# Patient Record
Sex: Male | Born: 1965 | Race: White | Hispanic: No | Marital: Married | Smoking: Never smoker
Health system: Southern US, Community
[De-identification: ages and names within clinical notes are randomized; demographics above are authoritative.]

---

## 2017-01-02 ENCOUNTER — Encounter: Payer: Self-pay | Admitting: Family Medicine

## 2017-01-02 ENCOUNTER — Ambulatory Visit: Payer: 59 | Admitting: Family Medicine

## 2017-01-02 VITALS — BP 148/98 | HR 87 | Ht 76.0 in | Wt 319.0 lb

## 2017-01-02 DIAGNOSIS — M999 Biomechanical lesion, unspecified: Secondary | ICD-10-CM | POA: Diagnosis not present

## 2017-01-02 DIAGNOSIS — M5442 Lumbago with sciatica, left side: Secondary | ICD-10-CM | POA: Diagnosis not present

## 2017-01-02 DIAGNOSIS — G5702 Lesion of sciatic nerve, left lower limb: Secondary | ICD-10-CM

## 2017-01-02 DIAGNOSIS — G8929 Other chronic pain: Secondary | ICD-10-CM

## 2017-01-02 MED ORDER — GABAPENTIN 100 MG PO CAPS: 200.0000 mg | ORAL_CAPSULE | Freq: Every day | ORAL | 3 refills | Status: AC

## 2017-01-02 NOTE — Assessment & Plan Note (Signed)
Piriformis syndrome.  Patient given home exercises to work with Event organiserathletic trainer. Patient will try home exercises working on hip abductors, icing regimen, which activities to do which wants to avoid.  She will follow-up with me again in 4-6 weeks for further evaluation and treatment

## 2017-01-02 NOTE — Patient Instructions (Signed)
Good to see you  Ice 20 minutes 2 times daily. Usually after activity and before bed. Exercises 3 times a week.  Tennis abll in back left pocket with a lot of sitting.  Gabapentin 1-2 pills at night can help Stay active.  See em again in 3-4 weeks

## 2017-01-02 NOTE — Assessment & Plan Note (Signed)
Decision today to treat with OMT was based on Physical Exam  After verbal consent patient was treated with HVLA, ME, FPR techniques in cervical, thoracic, lumbar and sacral areas  Patient tolerated the procedure well with improvement in symptoms  Patient given exercises, stretches and lifestyle modifications  See medications in patient instructions if given  Patient will follow up in 4-6 weeks 

## 2017-01-02 NOTE — Progress Notes (Signed)
Tawana ScaleZach Bret Stamour D.O. [facility] [address] [place], [place] [address] Phone: [phone]) [phone] Subjective:    CC: Back and leg pain  BJY:NWGNFAOZHYHPI:Subjective  Luis Saunders is a 51 y.o. male coming in for back pain. Seventeen years ago he saw a chiropractor for sciatica. He has been using a Land[facility] for years following. The past couple of months he has not had as much relief. He has radiating pain down the back side of the left leg going into the calf. Patient has used ice and IBU which helps temporarily. He does work out and does some stretching.  Patient states that it intermittently seems to be going down the patient states that there is no weakness.  Rates the severity of pain is 6 out of 10 but seems to be worsening.  States sometimes at night can be uncomfortable as well       History reviewed. No pertinent past medical history. History reviewed. No pertinent surgical history. Social History   Socioeconomic History  . Marital status: Married    Spouse name: None  . Number of children: None  . Years of education: None  . Highest education level: None  Social Needs  . Financial resource strain: None  . Food insecurity - worry: None  . Food insecurity - inability: None  . Transportation needs - medical: None  . Transportation needs - non-medical: None  Occupational History  . None  Tobacco Use  . Smoking status: None  Substance and Sexual Activity  . Alcohol use: None  . Drug use: None  . Sexual activity: None  Other Topics Concern  . None  Social History Narrative  . None   Not on File History reviewed. No pertinent family history.  No family history of autoimmune diseases   Past medical history, social, surgical and family history all reviewed in electronic medical record.  No pertanent information unless stated regarding to the chief complaint.   Review of Systems:Review of systems updated and as accurate as of 01/02/17  No headache, visual changes, nausea,  vomiting, diarrhea, constipation, dizziness, abdominal pain, skin rash, fevers, chills, night sweats, weight loss, swollen lymph nodes, body aches, joint swelling chest pain, shortness of breath, mood changes.  Positive muscle aches  Objective  Blood pressure (!) 148/98, pulse 87, height 6\' 4"  (1.93 m), weight (!) 319 lb (144.7 kg), SpO2 98 %. Systems examined below as of 01/02/17   General: No apparent distress alert and oriented x3 mood and affect normal, dressed appropriately.  HEENT: Pupils equal, extraocular movements intact  Respiratory: Patient's speak in full sentences and does not appear short of breath  Cardiovascular: No lower extremity edema, non tender, no erythema  Skin: Warm dry intact with no signs of infection or rash on extremities or on axial skeleton.  Abdomen: Soft nontender  Neuro: Cranial nerves II through XII are intact, neurovascularly intact in all extremities with 2+ DTRs and 2+ pulses.  Lymph: No lymphadenopathy of posterior or anterior cervical chain or axillae bilaterally.  Gait normal with good balance and coordination.  MSK:  Non tender with full range of motion and good stability and symmetric strength and tone of shoulders, elbows, wrist, hip, knee and ankles bilaterally.  Back Exam:  Inspection: Unremarkable  Motion: Flexion 45 deg, Extension 15 deg, Side Bending to 45 deg bilaterally,  Rotation to 45 deg bilaterally  SLR laying: Negative  XSLR laying: Negative  Palpable tenderness: Pain over the left sacroiliac joint and mild over the greater trochanteric  area. FABER: Mild positive left. Sensory change: Gross sensation intact to all lumbar and sacral dermatomes.  Reflexes: 2+ at both patellar tendons, 2+ at achilles tendons, Babinski's downgoing.  Strength at foot  Plantar-flexion: 5/5 Dorsi-flexion: 5/5 Eversion: 5/5 Inversion: 5/5  Leg strength  Quad: 5/5 Hamstring: 5/5 Hip flexor: 5/5 Hip abductors: 4/5 but symmetric Gait unremarkable. Osteopathic  findings C2 flexed rotated and side bent right C7 flexed rotated and side bent left T3 extended rotated and side bent right inhaled third rib T9 extended rotated and side bent left L2 flexed rotated and side bent right Sacrum right on right  Procedure 97110; 15 additional minutes spent for Therapeutic exercises as stated in above notes.  This included exercises focusing on stretching, strengthening, with significant focus on eccentric aspects.   Long term goals include an improvement in range of motion, strength, endurance as well as avoiding reinjury. Patient's frequency would include in 1-2 times a day, 3-5 times a week for a duration of 6-12 weeks. Piriformis Syndrome  Using an anatomical model, reviewed with the patient the structures involved and how they related to diagnosis. The patient indicated understanding.   The patient was given a handout from Dr. Ailene Ardsouzier's book "The Sports Medicine Patient Advisor" describing the anatomy and rehabilitation of the following condition: Piriformis Syndrome  Also given a handout with more extensive Piriformis stretching, hip flexor and abductor strengthening, ham stretching  Rec deep massage, explained self-massage with ball  Proper technique shown and discussed handout in great detail with ATC.  All questions were discussed and answered.      Impression and Recommendations:     This case required medical decision making of moderate complexity.      Note: This dictation was prepared with Dragon dictation along with smaller phrase technology. Any transcriptional errors that result from this process are unintentional.

## 2017-01-11 ENCOUNTER — Ambulatory Visit: Payer: Self-pay | Admitting: Family Medicine

## 2017-02-04 NOTE — Progress Notes (Signed)
[facility] [address] [place], [place] [address] Phone: [phone] Subjective:     CC: Back pain  BJY:NWGNFAOZHY  Coron Rossano is a 52 y.o. male coming in with complaint of back pain.  Found to have a left-sided low back pain.  Seem to be more of a piriformis syndrome.  Patient was to get x-rays which patient never did.  Patient did have osteopathic manipulation.  Patient states doing relatively well overall.  Patient intermittent.  States that that does seem to be somewhat worse with standing.     No past medical history on file. No past surgical history on file. Social History   Socioeconomic History  . Marital status: Married    Spouse name: None  . Number of children: None  . Years of education: None  . Highest education level: None  Social Needs  . Financial resource strain: None  . Food insecurity - worry: None  . Food insecurity - inability: None  . Transportation needs - medical: None  . Transportation needs - non-medical: None  Occupational History  . None  Tobacco Use  . Smoking status: Never Smoker  . Smokeless tobacco: Never Used  Substance and Sexual Activity  . Alcohol use: None  . Drug use: None  . Sexual activity: None  Other Topics Concern  . None  Social History Narrative  . None   Not on File No family history on file.  No family history of autoimmune disease   Past medical history, social, surgical and family history all reviewed in electronic medical record.  No pertanent information unless stated regarding to the chief complaint.   Review of Systems:Review of systems updated and as accurate as of 02/06/17  No headache, visual changes, nausea, vomiting, diarrhea, constipation, dizziness, abdominal pain, skin rash, fevers, chills, night sweats, weight loss, swollen lymph nodes, body aches, joint swelling, chest pain, shortness of breath, mood changes.  Positive muscle aches  Objective  Blood pressure  140/90, pulse 86, height 6\' 4"  (1.93 m), weight (!) 322 lb (146.1 kg), SpO2 98 %. Systems examined below as of 02/06/17   General: No apparent distress alert and oriented x3 mood and affect normal, dressed appropriately.  HEENT: Pupils equal, extraocular movements intact  Respiratory: Patient's speak in full sentences and does not appear short of breath  Cardiovascular: No lower extremity edema, non tender, no erythema  Skin: Warm dry intact with no signs of infection or rash on extremities or on axial skeleton.  Abdomen: Soft nontender  Neuro: Cranial nerves II through XII are intact, neurovascularly intact in all extremities with 2+ DTRs and 2+ pulses.  Lymph: No lymphadenopathy of posterior or anterior cervical chain or axillae bilaterally.  Gait normal with good balance and coordination.  MSK:  Non tender with full range of motion and good stability and symmetric strength and tone of shoulders, elbows, wrist, hip, knee and ankles bilaterally.  Back Exam:  Inspection: Mild loss of lordosis with poor core strength Motion: Flexion 45 deg, Extension 20 deg, Side Bending to 35 deg bilaterally,  Rotation to 05 deg bilaterally  SLR laying: Negative  XSLR laying: Negative  Palpable tenderness: Tender to palpation of the paraspinal musculature lumbar spine left greater than right. FABER: negative. Sensory change: Gross sensation intact to all lumbar and sacral dermatomes.  Reflexes: 2+ at both patellar tendons, 2+ at achilles tendons, Babinski's downgoing.  Strength at foot  Plantar-flexion: 5/5 Dorsi-flexion: 5/5 Eversion: 5/5 Inversion: 5/5  Leg strength  Quad: 5/5 Hamstring: 5/5 Hip flexor: 5/5 Hip abductors: 5/5  Gait unremarkable.  Osteopathic findings C2 flexed rotated and side bent right C4 flexed rotated and side bent left C7 flexed rotated and side bent left T3 extended rotated and side bent right inhaled third rib T12 extended rotated and side bent left L2 flexed rotated and  side bent right Sacrum right on right     Impression and Recommendations:     This case required medical decision making of moderate complexity.      Note: This dictation was prepared with Dragon dictation along with smaller phrase technology. Any transcriptional errors that result from this process are unintentional.

## 2017-02-06 ENCOUNTER — Encounter: Payer: Self-pay | Admitting: Family Medicine

## 2017-02-06 ENCOUNTER — Ambulatory Visit (INDEPENDENT_AMBULATORY_CARE_PROVIDER_SITE_OTHER)
Admission: RE | Admit: 2017-02-06 | Discharge: 2017-02-06 | Disposition: A | Discharge status: Home / Self Care | Payer: 59 | Source: Ambulatory Visit | Attending: Family Medicine | Admitting: Family Medicine

## 2017-02-06 ENCOUNTER — Ambulatory Visit: Payer: 59 | Admitting: Family Medicine

## 2017-02-06 VITALS — BP 140/90 | HR 86 | Ht 76.0 in | Wt 322.0 lb

## 2017-02-06 DIAGNOSIS — M5442 Lumbago with sciatica, left side: Secondary | ICD-10-CM

## 2017-02-06 DIAGNOSIS — G8929 Other chronic pain: Secondary | ICD-10-CM

## 2017-02-06 DIAGNOSIS — M545 Low back pain, unspecified: Secondary | ICD-10-CM | POA: Insufficient documentation

## 2017-02-06 DIAGNOSIS — M999 Biomechanical lesion, unspecified: Secondary | ICD-10-CM | POA: Diagnosis not present

## 2017-02-06 NOTE — Assessment & Plan Note (Signed)
Patient does have some sciatica.  Encouraged him to take the gabapentin.  Patient has a negative straight leg test but states that the pain seems to be worse with standing.  This is more concerning for spinal stenosis than the piriformis.  No weakness noted on exam today.  Does respond well to osteopathic manipulation.  Encouraged to continue to work on core strength, weight loss, and home exercises.  Follow-up again in 4 weeks

## 2017-02-06 NOTE — Patient Instructions (Signed)
Good to see you  I think we are doing well  Ice 20 minutes 2 times daily. Usually after activity and before bed. Xray downstairs See me again in 4 weeks Read about Dr. Dalbert GarnetBeasley and weight management Can't wait to meet your better half.

## 2017-02-06 NOTE — Assessment & Plan Note (Signed)
Decision today to treat with OMT was based on Physical Exam  After verbal consent patient was treated with HVLA, ME, FPR techniques in cervical, thoracic, lumbar and sacral areas  Patient tolerated the procedure well with improvement in symptoms  Patient given exercises, stretches and lifestyle modifications  See medications in patient instructions if given  Patient will follow up in 4 weeks 

## 2017-02-07 ENCOUNTER — Encounter: Payer: Self-pay | Admitting: Family Medicine

## 2017-02-09 ENCOUNTER — Encounter: Payer: Self-pay | Admitting: Family Medicine

## 2017-02-13 MED ORDER — AMBULATORY NON FORMULARY MEDICATION: 12 refills | Status: AC

## 2017-03-05 NOTE — Progress Notes (Signed)
Tawana ScaleZach Everlene Cunning D.O. [facility] [address] [place], [place] [address] Phone: [phone] Subjective:    CC: Back pain follow-up  BJY:NWGNFAOZHYHPI:Subjective  Luis Saunders is a 52 y.o. male coming in with complaint of low back pain.  Found to have a left-sided piriformis.  X-rays today show mild osteoarthritic changes.  Started osteopathic manipulation.  Concerned some of his symptoms could be secondary to spinal stenosis.  Patient was to start massage therapy as well.  Patient was to do home exercises.  Patient states patient has been doing relatively well overall.  Patient.  Did have some good improvement though with acupuncture as well.     No past medical history on file. No past surgical history on file. Social History   Socioeconomic History  . Marital status: Married    Spouse name: None  . Number of children: None  . Years of education: None  . Highest education level: None  Social Needs  . Financial resource strain: None  . Food insecurity - worry: None  . Food insecurity - inability: None  . Transportation needs - medical: None  . Transportation needs - non-medical: None  Occupational History  . None  Tobacco Use  . Smoking status: Never Smoker  . Smokeless tobacco: Never Used  Substance and Sexual Activity  . Alcohol use: None  . Drug use: None  . Sexual activity: None  Other Topics Concern  . None  Social History Narrative  . None   Not on File No family history on file.   Past medical history, social, surgical and family history all reviewed in electronic medical record.  No pertanent information unless stated regarding to the chief complaint.   Review of Systems:Review of systems updated and as accurate as of 03/06/17  No headache, visual changes, nausea, vomiting, diarrhea, constipation, dizziness, abdominal pain, skin rash, fevers, chills, night sweats, weight loss, swollen lymph nodes, body aches, joint swelling, muscle aches, chest pain, shortness  of breath, mood changes.   Objective  Blood pressure 136/90, pulse 87, height 6\' 4"  (1.93 m), weight (!) 320 lb (145.2 kg), SpO2 98 %. Systems examined below as of 03/06/17   General: No apparent distress alert and oriented x3 mood and affect normal, dressed appropriately.  HEENT: Pupils equal, extraocular movements intact  Respiratory: Patient's speak in full sentences and does not appear short of breath  Cardiovascular: No lower extremity edema, non tender, no erythema  Skin: Warm dry intact with no signs of infection or rash on extremities or on axial skeleton.  Abdomen: Soft nontender  Neuro: Cranial nerves II through XII are intact, neurovascularly intact in all extremities with 2+ DTRs and 2+ pulses.  Lymph: No lymphadenopathy of posterior or anterior cervical chain or axillae bilaterally.  Gait normal with good balance and coordination.  MSK:  Non tender with full range of motion and good stability and symmetric strength and tone of shoulders, elbows, wrist, hip, knee and ankles bilaterally.  Back Exam:  Inspection: Mild loss of lordosis and some mild poor core strength Motion: Flexion 45 deg, Extension 20 deg, Side Bending to 35 deg bilaterally,  Rotation to 35 deg bilaterally  SLR laying: Negative  XSLR laying: Negative  Palpable tenderness: Tender to palpation the paraspinal musculature lumbar spine right greater than left.Marland Kitchen. FABER: Tightness on the right. Sensory change: Gross sensation intact to all lumbar and sacral dermatomes.  Reflexes: 2+ at both patellar tendons, 2+ at achilles tendons, Babinski's downgoing.  Strength at foot  Plantar-flexion:  5/5 Dorsi-flexion: 5/5 Eversion: 5/5 Inversion: 5/5  Leg strength  Quad: 5/5 Hamstring: 5/5 Hip flexor: 5/5 Hip abductors: 5/5  Gait unremarkable.  Osteopathic findings T5 extended rotated and side bent left L1 flexed rotated and side bent right Sacrum right on right    Impression and Recommendations:     This case  required medical decision making of moderate complexity.      Note: This dictation was prepared with Dragon dictation along with smaller phrase technology. Any transcriptional errors that result from this process are unintentional.

## 2017-03-06 ENCOUNTER — Ambulatory Visit (INDEPENDENT_AMBULATORY_CARE_PROVIDER_SITE_OTHER): Payer: 59 | Admitting: Family Medicine

## 2017-03-06 ENCOUNTER — Encounter: Payer: Self-pay | Admitting: Family Medicine

## 2017-03-06 VITALS — BP 136/90 | HR 87 | Ht 76.0 in | Wt 320.0 lb

## 2017-03-06 DIAGNOSIS — G8929 Other chronic pain: Secondary | ICD-10-CM | POA: Diagnosis not present

## 2017-03-06 DIAGNOSIS — M5442 Lumbago with sciatica, left side: Secondary | ICD-10-CM | POA: Diagnosis not present

## 2017-03-06 DIAGNOSIS — M999 Biomechanical lesion, unspecified: Secondary | ICD-10-CM | POA: Diagnosis not present

## 2017-03-06 NOTE — Assessment & Plan Note (Signed)
Decision today to treat with OMT was based on Physical Exam  After verbal consent patient was treated with HVLA, ME, FPR techniques in cervical, thoracic, lumbar and sacral areas  Patient tolerated the procedure well with improvement in symptoms  Patient given exercises, stretches and lifestyle modifications  See medications in patient instructions if given  Patient will follow up in 6-8 weeks 

## 2017-03-06 NOTE — Patient Instructions (Signed)
Good to see you  Ice is your friend I like the accupuncture if it helps Overall not bad New exercises for the back  Lets get you moving See me again in 4-6 weeks. Can consider labs

## 2017-03-06 NOTE — Assessment & Plan Note (Signed)
Responding well to osteopathic manipulation..  Doing well with the over-the-counter medications.  X-rays only showed mild osteoarthritic changes.  Still signs and symptoms is consistent with the potential for spinal stenosis.  We discussed we can always inject the piriformis for diagnostic and therapeutic purposes.  Advanced imaging would be warranted if any weakness occurs.  Follow-up with me again now in 6-8 weeks.

## 2018-04-20 IMAGING — DX DG LUMBAR SPINE 2-3V
3 series · 3 of 3 positions shown · non-contrast
Comparison: None.

CLINICAL DATA: Chronic left-sided low back pain with sciatica

EXAM:
LUMBAR SPINE - 2-3 VIEW

[l-spine ap]
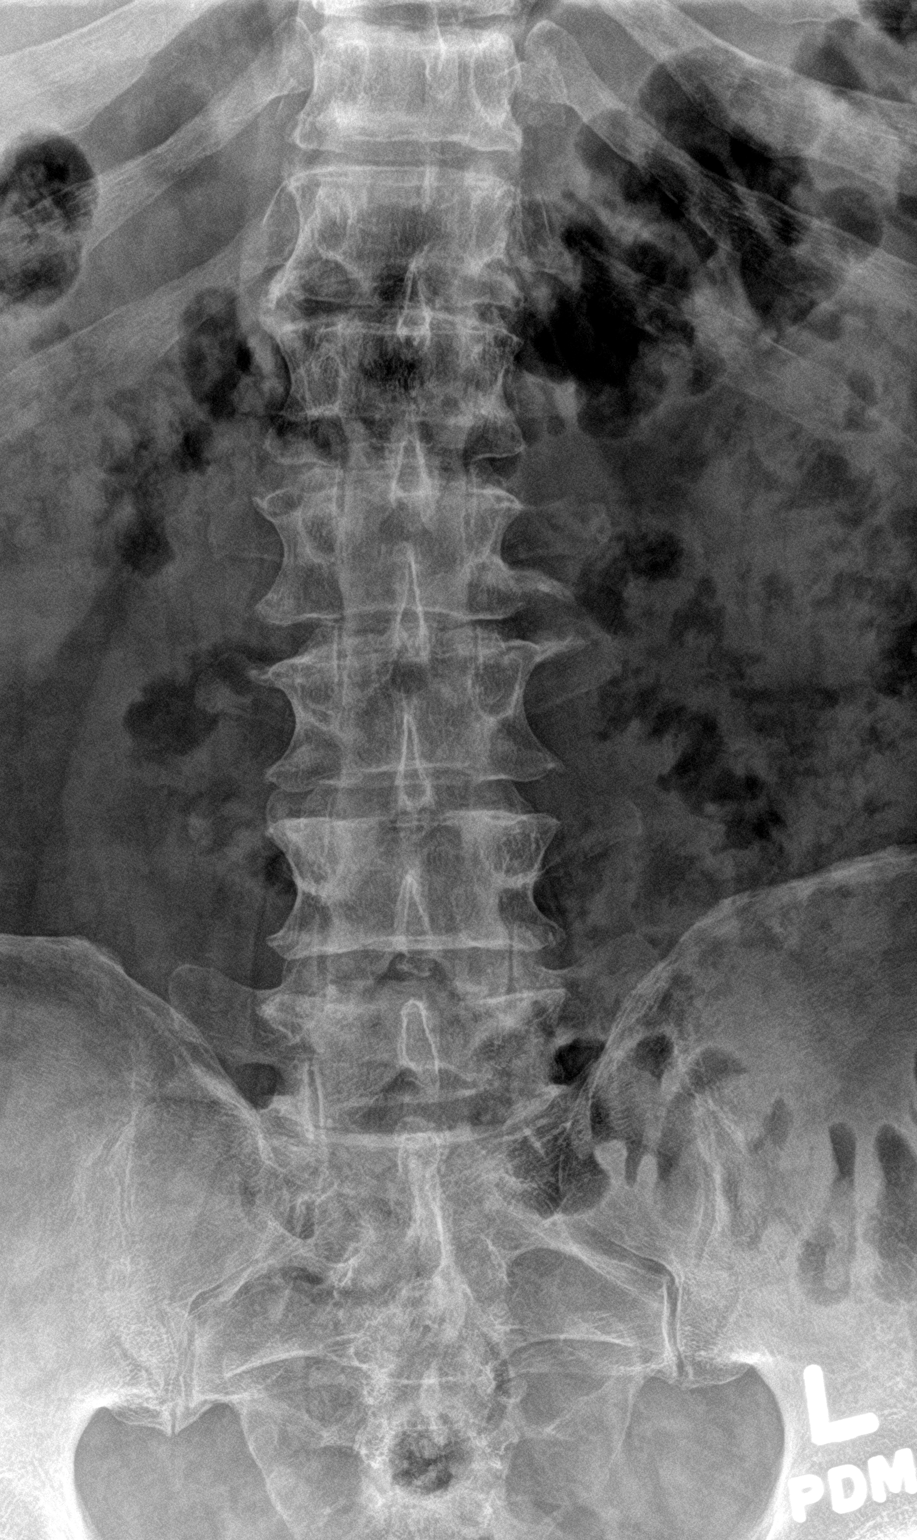

[l-spine lat]
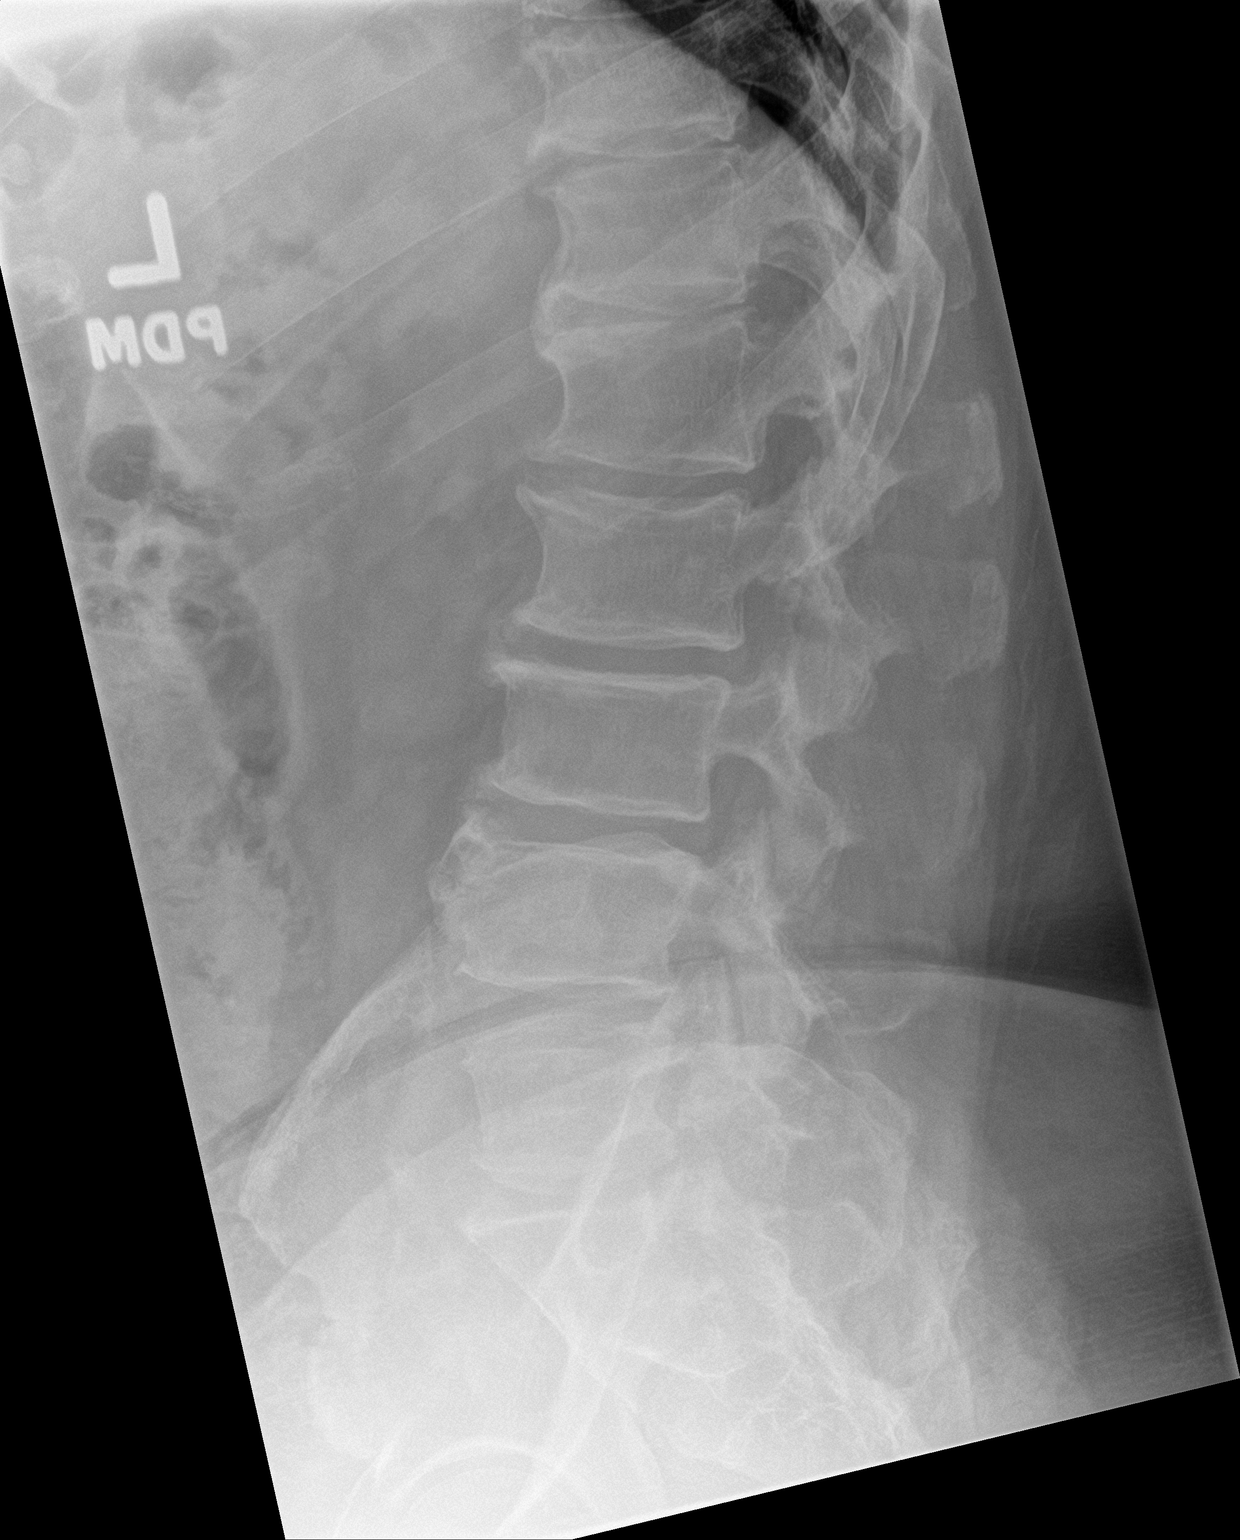

[l-spine spot]
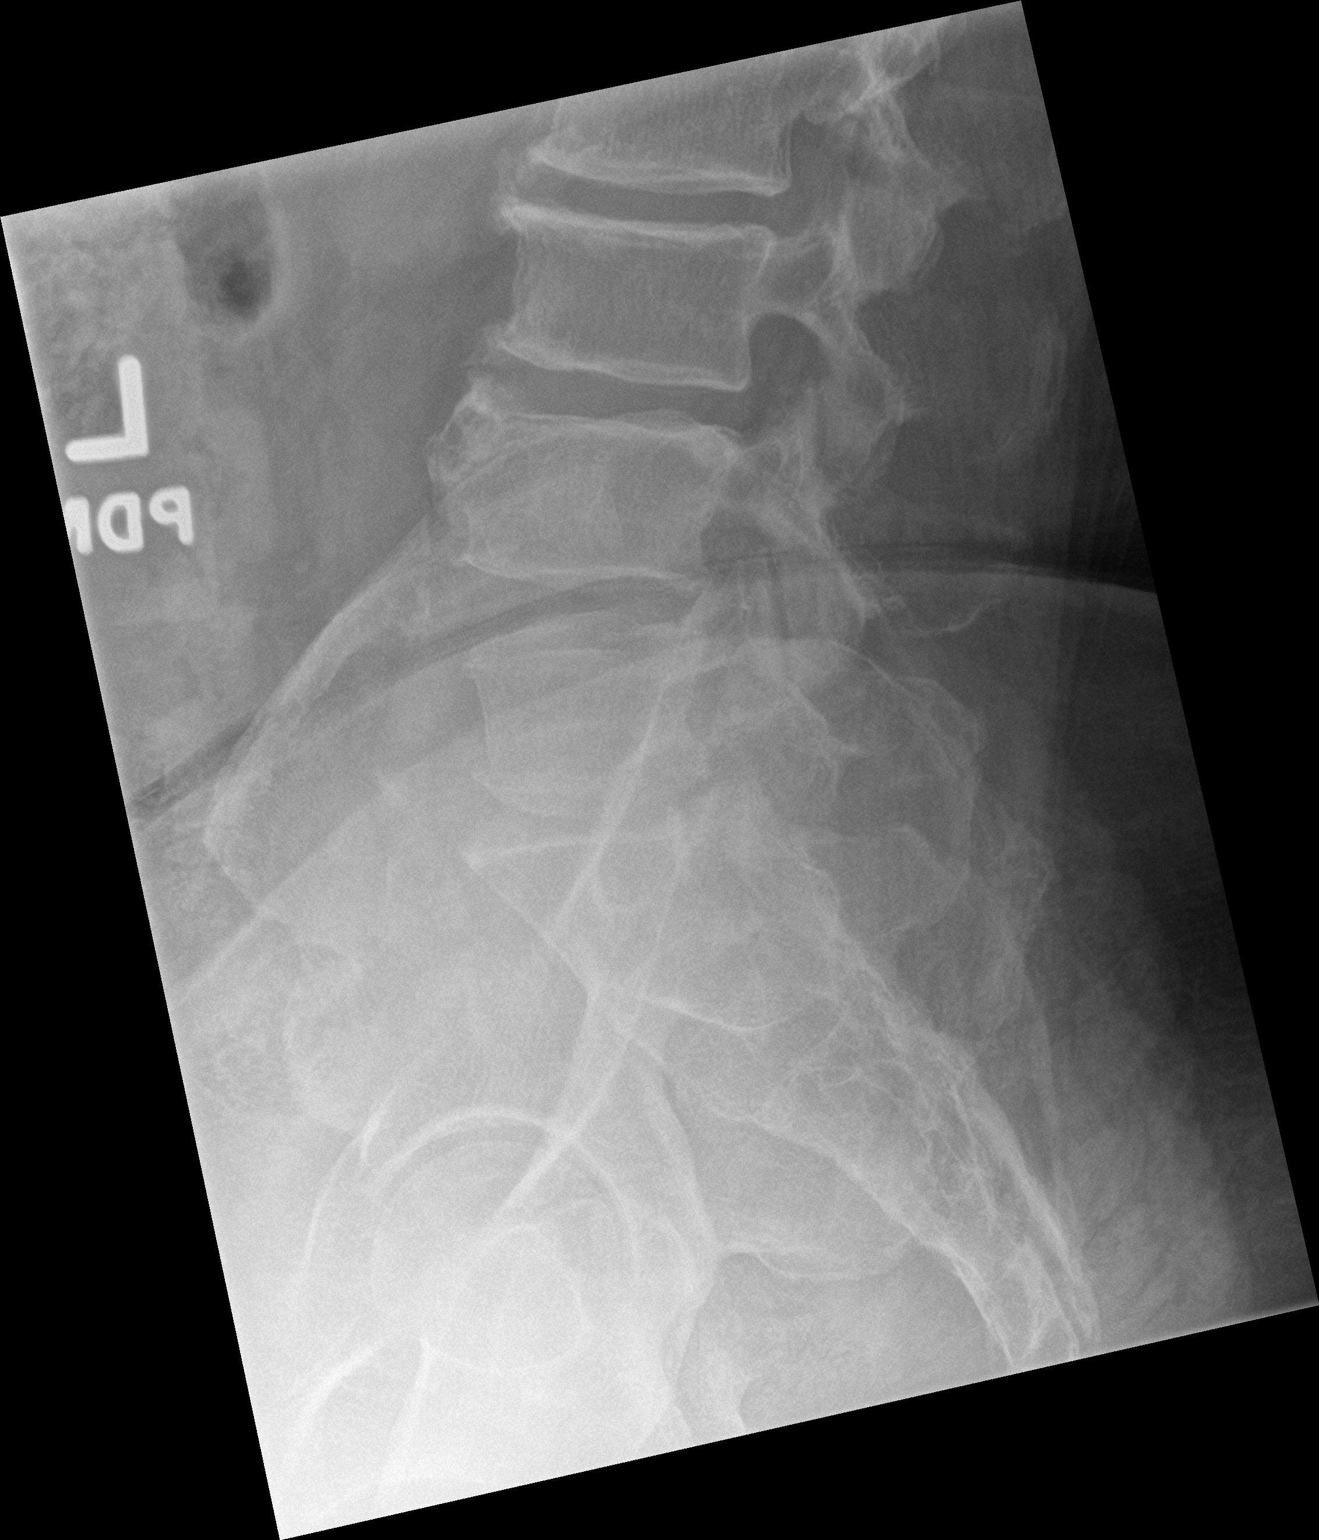

[3 of 3 positions shown; findings below may reference images not displayed]

FINDINGS: Normal alignment. Mild kyphosis at L1. Mild diffuse lumbar disc
degeneration with disc space narrowing and mild spurring. Negative
for fracture or pars defect.
IMPRESSION: Multilevel mild lumbar degenerative change with kyphosis. No acute
abnormality.

## 2023-05-11 ENCOUNTER — Ambulatory Visit (INDEPENDENT_AMBULATORY_CARE_PROVIDER_SITE_OTHER): Payer: PRIVATE HEALTH INSURANCE | Admitting: Sports Medicine

## 2023-05-11 ENCOUNTER — Ambulatory Visit (HOSPITAL_BASED_OUTPATIENT_CLINIC_OR_DEPARTMENT_OTHER)
Admission: RE | Admit: 2023-05-11 | Discharge: 2023-05-11 | Disposition: A | Discharge status: Home / Self Care | Source: Ambulatory Visit | Attending: Sports Medicine | Admitting: Sports Medicine

## 2023-05-11 ENCOUNTER — Encounter: Payer: Self-pay | Admitting: Sports Medicine

## 2023-05-11 VITALS — BP 130/82 | Ht 76.0 in | Wt 303.0 lb

## 2023-05-11 DIAGNOSIS — G8929 Other chronic pain: Secondary | ICD-10-CM

## 2023-05-11 DIAGNOSIS — M25512 Pain in left shoulder: Secondary | ICD-10-CM

## 2023-05-11 NOTE — Progress Notes (Signed)
   Subjective:    Patient ID: Luis Saunders, male    DOB: Sep 09, 1965, 58 y.o.   MRN: [id number]  HPI chief complaint: Left shoulder pain  [name] is a very pleasant 58 year old right-hand-dominant male that presents today with approximately 2 years of intermittent left shoulder pain.  He initially experienced pain in the shoulder when lifting weights.  He had some shoulder discomfort when doing some shoulder presses and also experienced some anterior shoulder pain with bench press.  He also had an episode of neck pain with anterior shoulder pain when golfing.  His main symptoms are what he describes as stiffness in the left shoulder.  It concerned him enough that he has stopped lifting weights but he would like to return to some sort of resistance training soon.  The discomfort is diffuse throughout the shoulder.  Not associated with any specific activity.  Symptoms occur weekly but are not long-lasting.  No prior shoulder surgeries.  No current neck pain.  No further episodes of pain with golf.  Pain does not radiate down the left arm.  No imaging.  He has worked with a Adult nurse and occupational therapist in the past.  Physical therapy did seem to be helpful.  Past medical history reviewed Medications reviewed Allergies reviewed   Review of Systems As above    Objective:   Physical Exam  Well-developed, well-nourished.  No acute distress.  Left shoulder: Full active forward flexion, abduction, and internal rotation.  There is some decreased passive external rotation in the left shoulder compared to the right.  This is not painful.  No tenderness to palpation over the bicipital groove nor over the [facility] joint.  Slight amount of discomfort with palpation of the pectoralis major tendon at its insertion onto the humerus.  No palpable defect.  Good pectoralis muscle strength.  Rotator cuff strength is 5/5.  Negative empty can, negative Hawkins.  Negative O'Brien's.  Negative speeds, negative  Yergason's.  Neurovascularly intact distally.      Assessment & Plan:   Chronic left shoulder pain  I would like to get some x-rays of the left shoulder including an AP and axillary view.  Some of his symptoms may be from some mild glenohumeral osteoarthritis.  Another possibility could be pectoralis tendinopathy.  I will MyChart message him once I review those x-rays.  I do think he will be fine to resume resistance training although he will need to adjust his routine based on his symptoms.  I will give him further advice once I reviewed his x-rays.  This note was dictated using Dragon naturally speaking software and may contain errors in syntax, spelling, or content which have not been identified prior to signing this note.   Addendum: X-ray reviewed.  There are moderate degenerative changes in the glenohumeral joint.  Radiology report was pending at the time of this dictation.

## 2023-05-12 ENCOUNTER — Encounter: Payer: Self-pay | Admitting: Sports Medicine

## 2023-06-22 ENCOUNTER — Ambulatory Visit (INDEPENDENT_AMBULATORY_CARE_PROVIDER_SITE_OTHER): Payer: PRIVATE HEALTH INSURANCE | Admitting: Sports Medicine

## 2023-06-22 ENCOUNTER — Encounter: Payer: Self-pay | Admitting: Sports Medicine

## 2023-06-22 VITALS — BP 138/82 | Ht 76.0 in | Wt 303.0 lb

## 2023-06-22 DIAGNOSIS — S29011D Strain of muscle and tendon of front wall of thorax, subsequent encounter: Secondary | ICD-10-CM | POA: Diagnosis not present

## 2023-06-22 MED ORDER — DEXAMETHASONE SODIUM PHOSPHATE 4 MG/ML IJ SOLN: INTRAMUSCULAR | 0 refills | Status: AC

## 2023-06-22 NOTE — Progress Notes (Signed)
   Subjective:    Patient ID: Luis Saunders, male    DOB: Jun 20, 1965, 58 y.o.   MRN: [id number]  HPI  [name] presents today for follow-up on left shoulder and anterior chest pain.  His shoulder pain completely resolved with a change in diet.  He has changed to a protein heavy diet with very limited processed sugar.  He believes that has made a big difference in his shoulder pain.  His anterior chest pain has improved but not completely resolved.  He localizes it to the musculotendinous junction of the pectoralis major muscle.  There are times where it is not bothersome at all but other times where it is more noticeable.  He is here today to see if there is anything further that can be done about it or if further imaging is necessary.   Review of Systems As above    Objective:   Physical Exam  Well-developed, well-nourished.  No acute distress.  There is slight tenderness to palpation at the musculotendinous junction of the left pectoralis muscle.  No defect.     Assessment & Plan:   Left pectoralis major tendinopathy  I think it would be helpful for the patient to return to physical therapy.  He is an established patient at [facility] physical therapy.  I spoke with them on the phone today to discuss iontophoresis or phonophoresis.  We will prescribe dexamethasone 0.4%, 30 cc and this will be [facility] physical therapy.  If he needs a formal referral back to that office from me,then he will let me know.  I do not feel that further imaging is warranted at this time.  Follow-up as needed.  This note was dictated using Dragon naturally speaking software and may contain errors in syntax, spelling, or content which have not been identified prior to signing this note.

## 2023-06-22 NOTE — Addendum Note (Signed)
 Addended by: Claybon Cuna on: 06/22/2023 01:42 PM   Modules accepted: Orders

## 2023-09-27 ENCOUNTER — Ambulatory Visit: Payer: PRIVATE HEALTH INSURANCE | Admitting: Family Medicine

## 2023-09-27 ENCOUNTER — Encounter: Payer: Self-pay | Admitting: Family Medicine

## 2023-09-27 ENCOUNTER — Ambulatory Visit: Payer: PRIVATE HEALTH INSURANCE

## 2023-09-27 VITALS — BP 128/86 | Ht 76.0 in | Wt 274.0 lb

## 2023-09-27 DIAGNOSIS — S29011D Strain of muscle and tendon of front wall of thorax, subsequent encounter: Secondary | ICD-10-CM

## 2023-09-27 NOTE — Progress Notes (Signed)
 PCP: Patient, No Pcp Per  Subjective:   HPI: Patient is a 58 y.o. male here for follow-up evaluation of left shoulder/left pec pain, he was last seen in clinic on 06/22/2023.  Patient reports that he has not had pain in his left shoulder for several months. He was also dealing with a strain of his left pectoralis muscle that had been improving with physical therapy. He states that he finished physical therapy 6-8 weeks ago and that he received treatment with iontophoresis. However, about 3 weeks ago after playing golf 3 days in a row, he developed shortness in his left chest again. The soreness in the left chest is associated with a sudden onset of possible nausea, flushing, and tightness sensation. Patient says the sensation is difficult to describe. He has undergone full cardiac work-up in the past and follows with Cardiology. He denies taking any medications for the pain.   No past medical history on file.  Current Outpatient Medications on File Prior to Visit  Medication Sig Dispense Refill   AMBULATORY NON FORMULARY MEDICATION Manual Massage-1 time a month for 12 months. 1 each 12   dexamethasone  (DECADRON ) 4 MG/ML injection Use as directed for iontophoresis 30 mL 0   gabapentin  (NEURONTIN ) 100 MG capsule Take 2 capsules (200 mg total) by mouth at bedtime. (Patient not taking: Reported on 02/06/2017) 60 capsule 3   No current facility-administered medications on file prior to visit.    No past surgical history on file.  Not on File  There were no vitals taken for this visit.      No data to display              No data to display              Objective:  Physical Exam:  Gen: NAD, comfortable in exam room  MSK: Left shoulder/chest Inspection: No bony or soft tissue abnormalities appreciated. Appears symmetrical in comparison to the right shoulder.  Palpation: There is point tenderness to palpation within the axillary fold. There is good pectoralis major muscle bulk  with no appreciable deformities. No tenderness to palpation of the clavicle, AC joint, bicipital groove, or posterior shoulder.  ROM: FROM with forward flexion, abduction/adduction, and internal/external rotation.  Strength: 5/5 with resisted internal/external rotation, flexion, and abduction. 5/5 chest fly. Neuro/Vac: Neurovascularly intact distally.  Special Tests: Negative empty can, negative O'Brien's, negative Neer's, negative Hawkin's, negative Speed's, negative Yergason's    Assessment & Plan:  1. Chronic left shoulder/chest soreness - Patient's exam in clinic today is reassuring and his left shoulder soreness sounds like it is musculoskeletal in nature. However, his occasional associated episodes of nausea, flushing, and tightness raise concerns for additional underlying pathology such as cardiac issues. However, patient has undergone a full evaluation from a cardiac perspective including stress test and sees his cardiologist annually. Per patient, his cardiology does not believe these symptoms are cardiac in nature. Discussed with patient that evaluation of underlying musculoskeletal pathology would involve ultrasound or potentially an MRI of the chest wall but that additional imaging is unlikely to change management given his benign exam findings. For the time being, recommended patient start with chest strengthening exercises and follow-up as needed. Patient is amenable to this plan.   - Chest strengthening exercises at home  - Consider MRI/ultrasound in the future should things worsen  - Consider Nitroglycerin patches as an adjunct therapy  - Follow-up as needed   Signe Ravel, MS4 [facility]

## 2023-09-27 NOTE — Patient Instructions (Signed)
 Your exam is reassuring. If this is the pec major it should improve with strengthening (flys, bench press, pushups) every other day. Can consider MRI/ultrasound of the chest wall but this is unlikely to change the management since strengthening is the treatment and surgery is not done for this. Consider nitroglycerin patches as an adjunct. Follow up with us  as needed.
